# Patient Record
Sex: Female | Born: 1975 | Race: Black or African American | Hispanic: No | Marital: Single | Smoking: Never smoker
Health system: Southern US, Community
[De-identification: ages and names within clinical notes are randomized; demographics above are authoritative.]

## PROBLEM LIST (undated history)

## (undated) HISTORY — PX: TUBAL LIGATION: SHX77

---

## 1998-02-21 ENCOUNTER — Other Ambulatory Visit: Admission: RE | Admit: 1998-02-21 | Discharge: 1998-02-21 | Payer: Self-pay | Admitting: Obstetrics

## 2000-12-14 ENCOUNTER — Emergency Department (HOSPITAL_COMMUNITY): Admission: EM | Admit: 2000-12-14 | Discharge: 2000-12-14 | Payer: Self-pay | Admitting: *Deleted

## 2002-01-26 ENCOUNTER — Other Ambulatory Visit: Admission: RE | Admit: 2002-01-26 | Discharge: 2002-01-26 | Payer: Self-pay | Admitting: Obstetrics and Gynecology

## 2003-06-20 ENCOUNTER — Other Ambulatory Visit: Admission: RE | Admit: 2003-06-20 | Discharge: 2003-06-20 | Payer: Self-pay | Admitting: Obstetrics and Gynecology

## 2003-09-04 ENCOUNTER — Inpatient Hospital Stay (HOSPITAL_COMMUNITY): Admission: AD | Admit: 2003-09-04 | Discharge: 2003-09-04 | Payer: Self-pay | Admitting: Obstetrics and Gynecology

## 2003-09-30 ENCOUNTER — Inpatient Hospital Stay (HOSPITAL_COMMUNITY): Admission: AD | Admit: 2003-09-30 | Discharge: 2003-10-03 | Payer: Self-pay | Admitting: Obstetrics and Gynecology

## 2005-09-21 ENCOUNTER — Emergency Department (HOSPITAL_COMMUNITY): Admission: AD | Admit: 2005-09-21 | Discharge: 2005-09-21 | Payer: Self-pay | Admitting: Family Medicine

## 2011-02-06 ENCOUNTER — Other Ambulatory Visit: Payer: Self-pay | Admitting: Obstetrics and Gynecology

## 2011-02-06 DIAGNOSIS — N6322 Unspecified lump in the left breast, upper inner quadrant: Secondary | ICD-10-CM

## 2011-02-12 ENCOUNTER — Ambulatory Visit
Admission: RE | Admit: 2011-02-12 | Discharge: 2011-02-12 | Disposition: A | Discharge status: Home / Self Care | Payer: 59 | Source: Ambulatory Visit | Attending: Obstetrics and Gynecology | Admitting: Obstetrics and Gynecology

## 2011-02-12 DIAGNOSIS — N6322 Unspecified lump in the left breast, upper inner quadrant: Secondary | ICD-10-CM

## 2012-12-30 ENCOUNTER — Ambulatory Visit (INDEPENDENT_AMBULATORY_CARE_PROVIDER_SITE_OTHER): Payer: 59 | Admitting: Emergency Medicine

## 2012-12-30 VITALS — BP 121/81 | HR 85 | Temp 98.0°F | Resp 16 | Ht 65.0 in | Wt 123.0 lb

## 2012-12-30 DIAGNOSIS — J029 Acute pharyngitis, unspecified: Secondary | ICD-10-CM

## 2012-12-30 MED ORDER — AZITHROMYCIN 250 MG PO TABS: ORAL_TABLET | ORAL | Status: DC

## 2012-12-30 NOTE — Patient Instructions (Addendum)
Strep Throat  Strep throat is an infection of the throat caused by a bacteria named Streptococcus pyogenes. Your caregiver may call the infection streptococcal "tonsillitis" or "pharyngitis" depending on whether there are signs of inflammation in the tonsils or back of the throat. Strep throat is most common in children aged 35 15 years during the cold months of the year, but it can occur in people of any age during any season. This infection is spread from person to person (contagious) through coughing, sneezing, or other close contact.  SYMPTOMS   · Fever or chills.  · Painful, swollen, red tonsils or throat.  · Pain or difficulty when swallowing.  · White or yellow spots on the tonsils or throat.  · Swollen, tender lymph nodes or "glands" of the neck or under the jaw.  · Red rash all over the body (rare).  DIAGNOSIS   Many different infections can cause the same symptoms. A test must be done to confirm the diagnosis so the right treatment can be given. A "rapid strep test" can help your caregiver make the diagnosis in a few minutes. If this test is not available, a light swab of the infected area can be used for a throat culture test. If a throat culture test is done, results are usually available in a day or two.  TREATMENT   Strep throat is treated with antibiotic medicine.  HOME CARE INSTRUCTIONS   · Gargle with 1 tsp of salt in 1 cup of warm water, 3 4 times per day or as needed for comfort.  · Family members who also have a sore throat or fever should be tested for strep throat and treated with antibiotics if they have the strep infection.  · Make sure everyone in your household washes their hands well.  · Do not share food, drinking cups, or personal items that could cause the infection to spread to others.  · You may need to eat a soft food diet until your sore throat gets better.  · Drink enough water and fluids to keep your urine clear or pale yellow. This will help prevent dehydration.  · Get plenty of  rest.  · Stay home from school, daycare, or work until you have been on antibiotics for 24 hours.  · Only take over-the-counter or prescription medicines for pain, discomfort, or fever as directed by your caregiver.  · If antibiotics are prescribed, take them as directed. Finish them even if you start to feel better.  SEEK MEDICAL CARE IF:   · The glands in your neck continue to enlarge.  · You develop a rash, cough, or earache.  · You cough up green, yellow-brown, or bloody sputum.  · You have pain or discomfort not controlled by medicines.  · Your problems seem to be getting worse rather than better.  SEEK IMMEDIATE MEDICAL CARE IF:   · You develop any new symptoms such as vomiting, severe headache, stiff or painful neck, chest pain, shortness of breath, or trouble swallowing.  · You develop severe throat pain, drooling, or changes in your voice.  · You develop swelling of the neck, or the skin on the neck becomes red and tender.  · You have a fever.  · You develop signs of dehydration, such as fatigue, dry mouth, and decreased urination.  · You become increasingly sleepy, or you cannot wake up completely.  Document Released: 07/04/2000 Document Revised: 06/23/2012 Document Reviewed: 09/05/2010  [employer]® Patient Information ©2014 [employer].

## 2012-12-30 NOTE — Progress Notes (Signed)
[facility] [address] [place] [address] [phone]  Date:  12/30/2012   Name:  Alicia Burgess   DOB:  [birth year]   MRN:  [id number]  PCP:  No PCP Per Patient    Chief Complaint: Sore Throat   History of Present Illness:  Alicia Burgess is a 37 y.o. very pleasant female patient who presents with the following:  Sore throat and fever.  No nausea or vomiting. No wheezing, shortness of breath or cough.  No coryza.  No rash.  No ear pain.  Has a fever of 101+ today.  No improvement with over the counter medications or other home remedies. Denies other complaint or health concern today.   There are no active problems to display for this patient.   History reviewed. No pertinent past medical history.  Past Surgical History  Procedure Laterality Date  . Cesarean section    . Tubal ligation      History  Substance Use Topics  . Smoking status: Never Smoker   . Smokeless tobacco: Not on file  . Alcohol Use: Yes    Family History  Problem Relation Age of Onset  . Lupus Sister     Allergies  Allergen Reactions  . Penicillins Itching    Medication list has been reviewed and updated.  No current outpatient prescriptions on file prior to visit.   No current facility-administered medications on file prior to visit.    Review of Systems:  As per HPI, otherwise negative.    Physical Examination: Filed Vitals:   12/30/12 2010  BP: 121/81  Pulse: 85  Temp: 98 F (36.7 C)  Resp: 16   Filed Vitals:   12/30/12 2010  Height: 5\' 5"  (1.651 m)  Weight: 123 lb (55.792 kg)   Body mass index is 20.47 kg/(m^2). Ideal Body Weight: Weight in (lb) to have BMI = 25: 149.9  GEN: WDWN, NAD, Non-toxic, A & O x 3 HEENT: Atraumatic, Normocephalic. Neck supple. No masses, No LAD.  Erythematous throat Ears and Nose: No external deformity. CV: RRR, No M/G/R. No JVD. No thrill. No extra heart sounds. PULM: CTA B, no wheezes, crackles, rhonchi. No  retractions. No resp. distress. No accessory muscle use. ABD: S, NT, ND, +BS. No rebound. No HSM. EXTR: No c/c/e NEURO Normal gait.  PSYCH: Normally interactive. Conversant. Not depressed or anxious appearing.  Calm demeanor.    Assessment and Plan: Acute pharyngitis Pen v k Follow up as needed   Signed,  Phillips Odor, MD

## 2017-03-06 ENCOUNTER — Emergency Department (HOSPITAL_COMMUNITY): Payer: 59

## 2017-03-06 ENCOUNTER — Emergency Department (HOSPITAL_COMMUNITY)
Admission: EM | Admit: 2017-03-06 | Discharge: 2017-03-07 | Disposition: A | Discharge status: Home / Self Care | Payer: 59 | Attending: Emergency Medicine | Admitting: Emergency Medicine

## 2017-03-06 ENCOUNTER — Encounter (HOSPITAL_COMMUNITY): Payer: Self-pay

## 2017-03-06 DIAGNOSIS — R200 Anesthesia of skin: Secondary | ICD-10-CM | POA: Diagnosis not present

## 2017-03-06 DIAGNOSIS — Z79899 Other long term (current) drug therapy: Secondary | ICD-10-CM | POA: Insufficient documentation

## 2017-03-06 DIAGNOSIS — R42 Dizziness and giddiness: Secondary | ICD-10-CM | POA: Diagnosis not present

## 2017-03-06 DIAGNOSIS — R531 Weakness: Secondary | ICD-10-CM

## 2017-03-06 DIAGNOSIS — R202 Paresthesia of skin: Secondary | ICD-10-CM | POA: Insufficient documentation

## 2017-03-06 DIAGNOSIS — R40241 Glasgow coma scale score 13-15, unspecified time: Secondary | ICD-10-CM | POA: Insufficient documentation

## 2017-03-06 LAB — COMPREHENSIVE METABOLIC PANEL
ALK PHOS: 63 U/L (ref 38–126)
ALT: 18 U/L (ref 14–54)
AST: 23 U/L (ref 15–41)
Albumin: 3.8 g/dL (ref 3.5–5.0)
Anion gap: 8 (ref 5–15)
BUN: 9 mg/dL (ref 6–20)
CALCIUM: 9.2 mg/dL (ref 8.9–10.3)
CO2: 24 mmol/L (ref 22–32)
CREATININE: 0.73 mg/dL (ref 0.44–1.00)
Chloride: 107 mmol/L (ref 101–111)
GFR calc non Af Amer: >60 mL/min (ref >60)
Glucose, Bld: 92 mg/dL (ref 65–99)
Potassium: 3.8 mmol/L (ref 3.5–5.1)
SODIUM: 139 mmol/L (ref 135–145)
Total Bilirubin: 0.4 mg/dL (ref 0.3–1.2)
Total Protein: 7.4 g/dL (ref 6.5–8.1)

## 2017-03-06 LAB — I-STAT CHEM 8, ED
BUN: 12 mg/dL (ref 6–20)
CALCIUM ION: 1.17 mmol/L (ref 1.15–1.40)
CHLORIDE: 105 mmol/L (ref 101–111)
CREATININE: 0.6 mg/dL (ref 0.44–1.00)
GLUCOSE: 94 mg/dL (ref 65–99)
HCT: 31 % — ABNORMAL LOW (ref 36.0–46.0)
Hemoglobin: 10.5 g/dL — ABNORMAL LOW (ref 12.0–15.0)
Potassium: 3.6 mmol/L (ref 3.5–5.1)
Sodium: 141 mmol/L (ref 135–145)
TCO2: 25 mmol/L (ref 0–100)

## 2017-03-06 LAB — PROTIME-INR
INR: 0.99
Prothrombin Time: 13.1 seconds (ref 11.4–15.2)

## 2017-03-06 LAB — CBC
HEMATOCRIT: 31.5 % — AB (ref 36.0–46.0)
Hemoglobin: 10.1 g/dL — ABNORMAL LOW (ref 12.0–15.0)
MCH: 26 pg (ref 26.0–34.0)
MCHC: 32.1 g/dL (ref 30.0–36.0)
MCV: 81.2 fL (ref 78.0–100.0)
Platelets: 429 10*3/uL — ABNORMAL HIGH (ref 150–400)
RBC: 3.88 MIL/uL (ref 3.87–5.11)
RDW: 14.9 % (ref 11.5–15.5)
WBC: 8.5 10*3/uL (ref 4.0–10.5)

## 2017-03-06 LAB — I-STAT TROPONIN, ED: Troponin i, poc: 0 ng/mL (ref 0.00–0.08)

## 2017-03-06 LAB — DIFFERENTIAL
Basophils Absolute: 0 10*3/uL (ref 0.0–0.1)
Basophils Relative: 0 %
Eosinophils Absolute: 0.7 10*3/uL (ref 0.0–0.7)
Eosinophils Relative: 8 %
LYMPHS PCT: 45 %
Lymphs Abs: 3.8 10*3/uL (ref 0.7–4.0)
MONO ABS: 0.7 10*3/uL (ref 0.1–1.0)
MONOS PCT: 8 %
NEUTROS ABS: 3.3 10*3/uL (ref 1.7–7.7)
Neutrophils Relative %: 39 %

## 2017-03-06 LAB — APTT: aPTT: 28 seconds (ref 24–36)

## 2017-03-06 MED ORDER — IOPAMIDOL (ISOVUE-370) INJECTION 76%
INTRAVENOUS | Status: AC
  Filled 2017-03-06: qty 50

## 2017-03-06 MED ORDER — LORAZEPAM 2 MG/ML IJ SOLN
1.0000 mg | Freq: Once | INTRAMUSCULAR | Status: AC
  Administered 2017-03-06: 1 mg via INTRAVENOUS
  Filled 2017-03-06: qty 1

## 2017-03-06 MED ORDER — GADOBENATE DIMEGLUMINE 529 MG/ML IV SOLN
15.0000 mL | Freq: Once | INTRAVENOUS | Status: AC
  Administered 2017-03-06: 15 mL via INTRAVENOUS

## 2017-03-06 NOTE — Code Documentation (Signed)
Code stroke called. Patient admitted with left arm/leg/face weakness, numbness, and tingling.  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 Code stroke cancelled per MD at 2049

## 2017-03-06 NOTE — ED Notes (Signed)
Patient transported to MRI 

## 2017-03-06 NOTE — ED Triage Notes (Signed)
Pt states that she was leaving work and that around 5pm her L arm started having numbness and tingling, up into shoulder, neck area, some dizziness which has resolved. Pt has L arm weakness and L arm drift.

## 2017-03-06 NOTE — ED Notes (Signed)
[facility] contacted to activate Code Stroke 

## 2017-03-06 NOTE — ED Notes (Signed)
[facility] contacted to deactivate Code Stroke

## 2017-03-06 NOTE — ED Provider Notes (Signed)
[facility]-EMERGENCY DEPT Provider Note   CSN: [id number] Arrival date & time: 03/06/17  1812     History   Chief Complaint Chief Complaint  Patient presents with  . Code Stroke    HPI Alicia Burgess is a 41 y.o. female.  HPI Patient is a previously healthy female who presents with numbness, tingling and weakness of left upper extremity starting at 5PM this evening. Patient noted onset of symptoms when she was taking her shirt off after work, noticed her left arm going limp  with associated numbness and tingling. She also had some associated dizziness. Her ventilator to come to the ED. She denies any similar symptoms to this in the past. Since her arrival to the ED, she has noticed some radiation of the numbness and tingling up her left neck, left tongue numbness, and left lower facial numbness. She denies any chest pain or shortness of breath. Code stroke activated in triage due to left upper extremity weakness.   History reviewed. No pertinent past medical history.  There are no active problems to display for this patient.   Past Surgical History:  Procedure Laterality Date  . CESAREAN SECTION    . TUBAL LIGATION      OB History    No data available       Home Medications    Prior to Admission medications   Medication Sig Start Date End Date Taking? Authorizing Provider  acetaminophen (TYLENOL) 500 MG tablet Take 1,000 mg by mouth every 6 (six) hours as needed for headache (pain).   Yes [provider]  ibuprofen (ADVIL,MOTRIN) 200 MG tablet Take 600 mg by mouth every 6 (six) hours as needed for headache (pain).   Yes [provider]  Multiple Vitamin (MULTIVITAMIN WITH MINERALS) TABS tablet Take 1 tablet by mouth at bedtime.   Yes [provider]    Family History Family History  Problem Relation Age of Onset  . Lupus Sister     Social History Social History  Substance Use Topics  . Smoking status: Never Smoker  . Smokeless tobacco:  Never Used  . Alcohol use Yes     Allergies   Strawberry (diagnostic) and Penicillins   Review of Systems Review of Systems  Constitutional: Negative for chills and fever.  HENT: Negative for ear pain and sore throat.   Eyes: Negative for pain and visual disturbance.  Respiratory: Negative for cough and shortness of breath.   Cardiovascular: Negative for chest pain and palpitations.  Gastrointestinal: Negative for abdominal pain and vomiting.  Genitourinary: Negative for dysuria and hematuria.  Musculoskeletal: Negative for arthralgias and back pain.  Skin: Negative for color change and rash.  Neurological: Positive for dizziness, weakness and numbness. Negative for seizures and syncope.  All other systems reviewed and are negative.    Physical Exam Updated Vital Signs BP 113/69   Pulse 74   Temp 98.8 F (37.1 C) (Oral)   Resp 18   Ht 5\' 8"  (1.727 m)   Wt 66.7 kg (147 lb)   SpO2 98%   BMI 22.35 kg/m   Physical Exam  Constitutional: She is oriented to person, place, and time. She appears well-developed and well-nourished. No distress.  HENT:  Head: Normocephalic and atraumatic.  Eyes: Conjunctivae are normal.  Neck: Neck supple.  Cardiovascular: Normal rate and regular rhythm.   No murmur heard. Pulmonary/Chest: Effort normal and breath sounds normal. No respiratory distress.  Abdominal: Soft. There is no tenderness.  Musculoskeletal: She exhibits no edema.  Neurological: She is alert and oriented to person, place, and time. She has normal strength. A cranial nerve deficit (decreased sensation to light tough left lower face) and sensory deficit (decreased sensation to LT to LUE) is present. She displays a negative Romberg sign. Coordination and gait normal. GCS eye subscore is 4. GCS verbal subscore is 5. GCS motor subscore is 6.  Inconsistent motor exam. Able to demonstrate 5/5 strength LUE intermittently.   Skin: Skin is warm and dry.  Psychiatric: She has a  normal mood and affect.  Nursing note and vitals reviewed.    ED Treatments / Results  Labs (all labs ordered are listed, but only abnormal results are displayed) Labs Reviewed  CBC - Abnormal; Notable for the following:       Result Value   Hemoglobin 10.1 (*)    HCT 31.5 (*)    Platelets 429 (*)    All other components within normal limits  I-STAT CHEM 8, ED - Abnormal; Notable for the following:    Hemoglobin 10.5 (*)    HCT 31.0 (*)    All other components within normal limits  PROTIME-INR  APTT  DIFFERENTIAL  COMPREHENSIVE METABOLIC PANEL  I-STAT TROPONIN, ED  CBG MONITORING, ED    EKG  EKG Interpretation None       Radiology Ct Angio Head W Or Wo Contrast  Addendum Date: 03/06/2017   ADDENDUM REPORT: 03/06/2017 20:51 ADDENDUM: These results were called by telephone at the time of interpretation on 03/06/2017 at 8:45 pm to Dr. Caryl Pina , who verbally acknowledged these results. Correction to "IMPRESSION" : There are only 3 items in the impression. Impression #4 should have been numbered #3 with no 4th item. Electronically Signed   By: Mitzi Hansen M.D.   On: 03/06/2017 20:51   Result Date: 03/06/2017 CLINICAL DATA:  41 y/o  F; left-sided weakness. EXAM: CT ANGIOGRAPHY HEAD AND NECK TECHNIQUE: Multidetector CT imaging of the head and neck was performed using the standard protocol during bolus administration of intravenous contrast. Multiplanar CT image reconstructions and MIPs were obtained to evaluate the vascular anatomy. Carotid stenosis measurements (when applicable) are obtained utilizing NASCET criteria, using the distal internal carotid diameter as the denominator. CONTRAST:  50 cc Isovue 370 COMPARISON:  03/06/2017 CT of the head. FINDINGS: CTA NECK FINDINGS Aortic arch: Bovine arch. Imaged portion shows no evidence of aneurysm or dissection. No significant stenosis of the major arch vessel origins. Right carotid system: No evidence of dissection,  stenosis (50% or greater) or occlusion. Left carotid system: No evidence of dissection, stenosis (50% or greater) or occlusion. Vertebral arteries: Codominant. No evidence of dissection, stenosis (50% or greater) or occlusion. Skeleton: Left maxillary molar dental carie. Otherwise unremarkable. Other neck: Negative. Upper chest: Negative. Review of the MIP images confirms the above findings CTA HEAD FINDINGS Anterior circulation: No significant stenosis, proximal occlusion, or aneurysm. Small cluster of vessels in the right sylvian fissure near the frontoparietal junction measuring 7 x 12 x 8 mm (AP by ML series 7, image 82 and series 9, image 54) with both arterial and venous structures. No aneurysm. Posterior circulation: No significant stenosis, proximal occlusion, aneurysm, or vascular malformation. Venous sinuses: As permitted by contrast timing, patent. Anatomic variants: Anterior communicating artery and bilateral posterior communicating arteries are present. Review of the MIP images confirms the above findings IMPRESSION: 1. Patent carotid and vertebral arteries. No dissection, aneurysm, or hemodynamically significant stenosis utilizing NASCET criteria. 2. Patent circle of Willis. No large vessel  occlusion, aneurysm, or significant stenosis. 3. Small cluster of vessels in the right sylvian fissure near frontoparietal junction with both arterial and prominent venous components measuring up to 12 mm may represent a small arteriovenous malformation. Electronically Signed: By: Mitzi Hansen M.D. On: 03/06/2017 20:35   Ct Angio Neck W Or Wo Contrast  Addendum Date: 03/06/2017   ADDENDUM REPORT: 03/06/2017 20:51 ADDENDUM: These results were called by telephone at the time of interpretation on 03/06/2017 at 8:45 pm to Dr. Caryl Pina , who verbally acknowledged these results. Correction to "IMPRESSION" : There are only 3 items in the impression. Impression #4 should have been numbered #3 with no 4th  item. Electronically Signed   By: Mitzi Hansen M.D.   On: 03/06/2017 20:51   Result Date: 03/06/2017 CLINICAL DATA:  41 y/o  F; left-sided weakness. EXAM: CT ANGIOGRAPHY HEAD AND NECK TECHNIQUE: Multidetector CT imaging of the head and neck was performed using the standard protocol during bolus administration of intravenous contrast. Multiplanar CT image reconstructions and MIPs were obtained to evaluate the vascular anatomy. Carotid stenosis measurements (when applicable) are obtained utilizing NASCET criteria, using the distal internal carotid diameter as the denominator. CONTRAST:  50 cc Isovue 370 COMPARISON:  03/06/2017 CT of the head. FINDINGS: CTA NECK FINDINGS Aortic arch: Bovine arch. Imaged portion shows no evidence of aneurysm or dissection. No significant stenosis of the major arch vessel origins. Right carotid system: No evidence of dissection, stenosis (50% or greater) or occlusion. Left carotid system: No evidence of dissection, stenosis (50% or greater) or occlusion. Vertebral arteries: Codominant. No evidence of dissection, stenosis (50% or greater) or occlusion. Skeleton: Left maxillary molar dental carie. Otherwise unremarkable. Other neck: Negative. Upper chest: Negative. Review of the MIP images confirms the above findings CTA HEAD FINDINGS Anterior circulation: No significant stenosis, proximal occlusion, or aneurysm. Small cluster of vessels in the right sylvian fissure near the frontoparietal junction measuring 7 x 12 x 8 mm (AP by ML series 7, image 82 and series 9, image 54) with both arterial and venous structures. No aneurysm. Posterior circulation: No significant stenosis, proximal occlusion, aneurysm, or vascular malformation. Venous sinuses: As permitted by contrast timing, patent. Anatomic variants: Anterior communicating artery and bilateral posterior communicating arteries are present. Review of the MIP images confirms the above findings IMPRESSION: 1. Patent carotid  and vertebral arteries. No dissection, aneurysm, or hemodynamically significant stenosis utilizing NASCET criteria. 2. Patent circle of Willis. No large vessel occlusion, aneurysm, or significant stenosis. 3. Small cluster of vessels in the right sylvian fissure near frontoparietal junction with both arterial and prominent venous components measuring up to 12 mm may represent a small arteriovenous malformation. Electronically Signed: By: Mitzi Hansen M.D. On: 03/06/2017 20:35   Mr Maxine Glenn Head Wo Contrast  Result Date: 03/06/2017 CLINICAL DATA:  Followup AVM. EXAM: MRI HEAD WITHOUT AND WITH CONTRAST MRA HEAD WITHOUT CONTRAST TECHNIQUE: Multiplanar, multiecho pulse sequences of the brain and surrounding structures were obtained without and with intravenous contrast. Angiographic images of the head were obtained using MRA technique without contrast. CONTRAST:  15 cc MULTIHANCE GADOBENATE DIMEGLUMINE 529 MG/ML IV SOLN COMPARISON:  CTA HEAD March 06, 2017 at 2005 hours and CT HEAD March 06, 2017 at 1932 hours FINDINGS: MRI HEAD FINDINGS INTRACRANIAL CONTENTS: No reduced diffusion to suggest acute ischemia, hyperacute demyelination. No susceptibility artifact to suggest hemorrhage. The ventricles and sulci are normal for patient's age. Scattered subcentimeter supratentorial white matter FLAIR T2 predominately in deep and juxta cortical distribution hyperintensities.  No abnormal signal about the RIGHT frontoparietal vascular malformation. No suspicious parenchymal signal, masses, mass effect. No abnormal intraparenchymal or extra-axial enhancement. No abnormal extra-axial fluid collections. No extra-axial masses. Prominent vessels RIGHT sylvian fissure without intraparenchymal extension. VASCULAR: Normal major intracranial vascular flow voids present at skull base. SKULL AND UPPER CERVICAL SPINE: No abnormal sellar expansion. No suspicious calvarial bone marrow signal. Craniocervical junction maintained.  SINUSES/ORBITS: Paranasal sinus mucosal thickening with maxillary sinus air-fluid levels. Mastoid air cells are well aerated.The included ocular globes and orbital contents are non-suspicious. OTHER: None. MRA HEAD FINDINGS ANTERIOR CIRCULATION: Normal flow related enhancement of the included cervical, petrous, cavernous and supraclinoid internal carotid arteries. Patent anterior communicating artery. Patent anterior and middle cerebral arteries, including distal segments. Prominent loop of vessels within RIGHT sylvian fissure, no discrete aneurysm. No large vessel occlusion, high-grade stenosis, aneurysm. POSTERIOR CIRCULATION: Codominant vertebral artery's. Basilar artery is patent, with normal flow related enhancement of the main branch vessels. Patent posterior cerebral arteries. Bilateral posterior communicating artery is present. No large vessel occlusion, high-grade stenosis,  aneurysm. ANATOMIC VARIANTS: None. Source images and MIP images were reviewed. IMPRESSION: MRI HEAD: 1. No convincing evidence of arteriovenous malformation. Reference standard for vascular malformations is digital subtraction angiography. 2. Mild white matter changes seen with chronic small vessel ischemic disease. MRA HEAD: 1. No emergent large vessel occlusion or stenosis; negative MRA head. Electronically Signed   By: Awilda Metro M.D.   On: 03/06/2017 23:35   Mr Laqueta Jean WU Contrast  Result Date: 03/06/2017 CLINICAL DATA:  Followup AVM. EXAM: MRI HEAD WITHOUT AND WITH CONTRAST MRA HEAD WITHOUT CONTRAST TECHNIQUE: Multiplanar, multiecho pulse sequences of the brain and surrounding structures were obtained without and with intravenous contrast. Angiographic images of the head were obtained using MRA technique without contrast. CONTRAST:  15 cc MULTIHANCE GADOBENATE DIMEGLUMINE 529 MG/ML IV SOLN COMPARISON:  CTA HEAD March 06, 2017 at 2005 hours and CT HEAD March 06, 2017 at 1932 hours FINDINGS: MRI HEAD FINDINGS  INTRACRANIAL CONTENTS: No reduced diffusion to suggest acute ischemia, hyperacute demyelination. No susceptibility artifact to suggest hemorrhage. The ventricles and sulci are normal for patient's age. Scattered subcentimeter supratentorial white matter FLAIR T2 predominately in deep and juxta cortical distribution hyperintensities. No abnormal signal about the RIGHT frontoparietal vascular malformation. No suspicious parenchymal signal, masses, mass effect. No abnormal intraparenchymal or extra-axial enhancement. No abnormal extra-axial fluid collections. No extra-axial masses. Prominent vessels RIGHT sylvian fissure without intraparenchymal extension. VASCULAR: Normal major intracranial vascular flow voids present at skull base. SKULL AND UPPER CERVICAL SPINE: No abnormal sellar expansion. No suspicious calvarial bone marrow signal. Craniocervical junction maintained. SINUSES/ORBITS: Paranasal sinus mucosal thickening with maxillary sinus air-fluid levels. Mastoid air cells are well aerated.The included ocular globes and orbital contents are non-suspicious. OTHER: None. MRA HEAD FINDINGS ANTERIOR CIRCULATION: Normal flow related enhancement of the included cervical, petrous, cavernous and supraclinoid internal carotid arteries. Patent anterior communicating artery. Patent anterior and middle cerebral arteries, including distal segments. Prominent loop of vessels within RIGHT sylvian fissure, no discrete aneurysm. No large vessel occlusion, high-grade stenosis, aneurysm. POSTERIOR CIRCULATION: Codominant vertebral artery's. Basilar artery is patent, with normal flow related enhancement of the main branch vessels. Patent posterior cerebral arteries. Bilateral posterior communicating artery is present. No large vessel occlusion, high-grade stenosis,  aneurysm. ANATOMIC VARIANTS: None. Source images and MIP images were reviewed. IMPRESSION: MRI HEAD: 1. No convincing evidence of arteriovenous malformation. Reference  standard for vascular malformations is digital subtraction angiography. 2. Mild white matter changes seen  with chronic small vessel ischemic disease. MRA HEAD: 1. No emergent large vessel occlusion or stenosis; negative MRA head. Electronically Signed   By: Awilda Metro M.D.   On: 03/06/2017 23:35   Ct Head Code Stroke Wo Contrast  Result Date: 03/06/2017 CLINICAL DATA:  Code stroke. Left arm numbness and drift. Last seen normal 2.5 hours ago. EXAM: CT HEAD WITHOUT CONTRAST TECHNIQUE: Contiguous axial images were obtained from the base of the skull through the vertex without intravenous contrast. COMPARISON:  None. FINDINGS: Brain: Normal appearance without evidence of atrophy, old or acute infarction, mass lesion, hemorrhage, hydrocephalus or extra-axial collection. Vascular: No abnormal vascular findings. Skull: Normal Sinuses/Orbits: Normal Other: None ASPECTS ([place] Stroke Program Early CT Score) - Ganglionic level infarction (caudate, lentiform nuclei, internal capsule, insula, M1-M3 cortex): 7 - Supraganglionic infarction (M4-M6 cortex): 3 Total score (0-10 with 10 being normal): 10 IMPRESSION: 1. Normal head CT 2. ASPECTS is 10. Page placed at the time of interpretation on 03/06/2017 at 7:35 pm to Dr. Otelia Limes. Electronically Signed   By: Paulina Fusi M.D.   On: 03/06/2017 19:37    Procedures Procedures (including critical care time)  Medications Ordered in ED Medications  iopamidol (ISOVUE-370) 76 % injection (not administered)  LORazepam (ATIVAN) injection 1 mg (1 mg Intravenous Given 03/06/17 2143)  gadobenate dimeglumine (MULTIHANCE) injection 15 mL (15 mLs Intravenous Contrast Given 03/06/17 2301)     Initial Impression / Assessment and Plan / ED Course  I have reviewed the triage vital signs and the nursing notes.  Pertinent labs & imaging results that were available during my care of the patient were reviewed by me and considered in my medical decision making (see chart for  details).     Patient is a previously healthy 41 y/o female who presents with acute onset left upper extremity numbness and tingling. Code stroke initiated on arrival due to paresthesias and left pronator drift in triage.  Patient arrived [facility] in no acute distress. Inconsistent effort on neuro exam. Numbness and tingling left upper extremity. No neck tenderness to palpation.   CT head negative, CTA head and neck showing possible AV malformation. Code stroke canceled per Neurology. MRI negative for acute findings. Labs stable.   Patient with subjective improvement of symptoms on repeat assessment. Reassured results. Discussed possibility of cervical radiculopathy, although given acute onset of symptoms without neck pain less likely. Discussed conversion disorder. Patient admitting to several stressors in her life right now. I advised close follow-up with her primary care physician. Return precautions discussed. Patient agreement with plan at time of discharge.  Patient and plan of care discussed with Attending physician, Dr. Lynelle Doctor.    Final Clinical Impressions(s) / ED Diagnoses   Final diagnoses:  Numbness  Weakness    New Prescriptions New Prescriptions   No medications on file     Wynelle Cleveland, MD 03/07/17 Jetta Lout, MD 03/07/17 1350

## 2017-03-07 DIAGNOSIS — R531 Weakness: Secondary | ICD-10-CM

## 2017-03-07 DIAGNOSIS — R200 Anesthesia of skin: Secondary | ICD-10-CM

## 2017-03-07 NOTE — Discharge Instructions (Signed)
Your work up was reassuring today. Please return to ED with any new or concerning symptoms. Please follow up with PCM as instructed.

## 2017-03-07 NOTE — Consult Note (Signed)
NEURO HOSPITALIST CONSULT NOTE   Requestig physician: Dr. Marcina Millard  Reason for Consult: Acute onset of left arm numbness and weakness  History obtained from:  Patient   HPI:                                                                                                                                          Alicia Burgess is an 41 y.o. female who presents to the ED with acute onset of LUE weakness, paresthesia and sensory numbness extending from her shoulder to her fingertips circumferentially. Sensory symptoms also radiated to the left side of her neck. Symptoms began at 5 PM. She first noticed the symptoms while taking her shirt off after work, with her left arm going limp in conjunction with the above sensory symptoms. She had some dizziness described as presyncopal at that time. Her daughter told her to come to the ED. After ED arrival she also noted some radiation of numbness to her left tongue and left lower face.  Denies headache, chest pain, vision changes, confusion or difficulty understanding speech. Also no chills, fever, cough, SOB or dysuria.    History reviewed. No pertinent past medical history.  Past Surgical History:  Procedure Laterality Date  . CESAREAN SECTION    . TUBAL LIGATION      Family History  Problem Relation Age of Onset  . Lupus Sister     Social History:  reports that she has never smoked. She has never used smokeless tobacco. She reports that she drinks alcohol. She reports that she does not use drugs.  Allergies  Allergen Reactions  . Strawberry (Diagnostic) Shortness Of Breath and Rash  . Penicillins Itching    Felt like skin was crawling Has patient had a PCN reaction causing immediate rash, facial/tongue/throat swelling, SOB or lightheadedness with hypotension: No Has patient had a PCN reaction causing severe rash involving mucus membranes or skin necrosis: No Has patient had a PCN reaction that required hospitalization:  No Has patient had a PCN reaction occurring within the last 10 years: Yes If all of the above answers are "NO", then may proceed with Cephalosporin use.     HOME MEDICATIONS:  Tylenol Ibuprofen MVI   ROS:                                                                                                                                       As per HPI.   Blood pressure 113/69, pulse 74, temperature 98.8 F (37.1 C), temperature source Oral, resp. rate 18, height 5\' 8"  (1.727 m), weight 66.7 kg (147 lb), SpO2 98 %.   General Examination:                                                                                                      HEENT-  [place]/AT    Lungs- Respirations unlabored Extremities- No edema  Neurological Examination Mental Status: Awake and alert, oriented, thought content appropriate. In the context of varying degrees of hypophonia, speech fluent with intact comprehension, naming and repetition. Able to follow all commands without difficulty. Per nursing, the patient's hypophonia resolves intermittently when speaking in private with nursing staff.  Cranial Nerves: II: Visual fields intact. PERRL  III,IV, VI: ptosis not present, EOMI without nystagmus V,VII: smile symmetric, facial temp sensation decreased on the left VIII: hearing intact to voice IX,X: intermittent hypophonia XI: Decreased effort with left shoulder shrug XII: midline tongue extension Motor: LUE: Slow coordinated movement with 4/5 maximum elicitable strength with some giveway/variability and responsiveness to coaching LLE: 4/5 maximum elicitable HF, KE, ADF and APF with some variability and responsiveness to coaching RUE and RLE: 5/5. Exhibited muscle contraction of knee extensors when asked to relax leg.  Normal tone throughout; no atrophy noted Sensory: Temp decreased to LUE; normal to  LLE, RLE and RUE. Intermittent subjective loss of sensation to FT LUE and LLE. Normal FT RUE and RLE Deep Tendon Reflexes: 2+ brachioradialis, biceps, patella and achilles bilaterally. Toes downgoing.  Cerebellar: No ataxia with FNF on right. Slow, deliberate, coordinated movements with FNF on left, exhibiting no ataxia.  Gait: Deferred  Lab Results: Basic Metabolic Panel:  Recent Labs Lab 03/06/17 1920 03/06/17 1931  NA 139 141  K 3.8 3.6  CL 107 105  CO2 24  --   GLUCOSE 92 94  BUN 9 12  CREATININE 0.73 0.60  CALCIUM 9.2  --     Liver Function Tests:  Recent Labs Lab 03/06/17 1920  AST 23  ALT 18  ALKPHOS 63  BILITOT 0.4  PROT 7.4  ALBUMIN 3.8   No results for input(s): LIPASE, AMYLASE in the last 168 hours. No results for input(s): AMMONIA  in the last 168 hours.  CBC:  Recent Labs Lab 03/06/17 1920 03/06/17 1931  WBC 8.5  --   NEUTROABS 3.3  --   HGB 10.1* 10.5*  HCT 31.5* 31.0*  MCV 81.2  --   PLT 429*  --     Cardiac Enzymes: No results for input(s): CKTOTAL, CKMB, CKMBINDEX, TROPONINI in the last 168 hours.  Lipid Panel: No results for input(s): CHOL, TRIG, HDL, CHOLHDL, VLDL, LDLCALC in the last 168 hours.  CBG: No results for input(s): GLUCAP in the last 168 hours.  Microbiology: No results found for this or any previous visit.  Coagulation Studies:  Recent Labs  03/06/17 1920  LABPROT 13.1  INR 0.99    Imaging: Ct Angio Head W Or Wo Contrast  Addendum Date: 03/06/2017   ADDENDUM REPORT: 03/06/2017 20:51 ADDENDUM: These results were called by telephone at the time of interpretation on 03/06/2017 at 8:45 pm to Dr. Caryl Pina , who verbally acknowledged these results. Correction to "IMPRESSION" : There are only 3 items in the impression. Impression #4 should have been numbered #3 with no 4th item. Electronically Signed   By: Mitzi Hansen M.D.   On: 03/06/2017 20:51   Result Date: 03/06/2017 CLINICAL DATA:  41 y/o  F;  left-sided weakness. EXAM: CT ANGIOGRAPHY HEAD AND NECK TECHNIQUE: Multidetector CT imaging of the head and neck was performed using the standard protocol during bolus administration of intravenous contrast. Multiplanar CT image reconstructions and MIPs were obtained to evaluate the vascular anatomy. Carotid stenosis measurements (when applicable) are obtained utilizing NASCET criteria, using the distal internal carotid diameter as the denominator. CONTRAST:  50 cc Isovue 370 COMPARISON:  03/06/2017 CT of the head. FINDINGS: CTA NECK FINDINGS Aortic arch: Bovine arch. Imaged portion shows no evidence of aneurysm or dissection. No significant stenosis of the major arch vessel origins. Right carotid system: No evidence of dissection, stenosis (50% or greater) or occlusion. Left carotid system: No evidence of dissection, stenosis (50% or greater) or occlusion. Vertebral arteries: Codominant. No evidence of dissection, stenosis (50% or greater) or occlusion. Skeleton: Left maxillary molar dental carie. Otherwise unremarkable. Other neck: Negative. Upper chest: Negative. Review of the MIP images confirms the above findings CTA HEAD FINDINGS Anterior circulation: No significant stenosis, proximal occlusion, or aneurysm. Small cluster of vessels in the right sylvian fissure near the frontoparietal junction measuring 7 x 12 x 8 mm (AP by ML series 7, image 82 and series 9, image 54) with both arterial and venous structures. No aneurysm. Posterior circulation: No significant stenosis, proximal occlusion, aneurysm, or vascular malformation. Venous sinuses: As permitted by contrast timing, patent. Anatomic variants: Anterior communicating artery and bilateral posterior communicating arteries are present. Review of the MIP images confirms the above findings IMPRESSION: 1. Patent carotid and vertebral arteries. No dissection, aneurysm, or hemodynamically significant stenosis utilizing NASCET criteria. 2. Patent circle of Willis.  No large vessel occlusion, aneurysm, or significant stenosis. 3. Small cluster of vessels in the right sylvian fissure near frontoparietal junction with both arterial and prominent venous components measuring up to 12 mm may represent a small arteriovenous malformation. Electronically Signed: By: Mitzi Hansen M.D. On: 03/06/2017 20:35   Ct Angio Neck W Or Wo Contrast  Addendum Date: 03/06/2017   ADDENDUM REPORT: 03/06/2017 20:51 ADDENDUM: These results were called by telephone at the time of interpretation on 03/06/2017 at 8:45 pm to Dr. Caryl Pina , who verbally acknowledged these results. Correction to "IMPRESSION" : There are only 3 items  in the impression. Impression #4 should have been numbered #3 with no 4th item. Electronically Signed   By: Mitzi Hansen M.D.   On: 03/06/2017 20:51   Result Date: 03/06/2017 CLINICAL DATA:  41 y/o  F; left-sided weakness. EXAM: CT ANGIOGRAPHY HEAD AND NECK TECHNIQUE: Multidetector CT imaging of the head and neck was performed using the standard protocol during bolus administration of intravenous contrast. Multiplanar CT image reconstructions and MIPs were obtained to evaluate the vascular anatomy. Carotid stenosis measurements (when applicable) are obtained utilizing NASCET criteria, using the distal internal carotid diameter as the denominator. CONTRAST:  50 cc Isovue 370 COMPARISON:  03/06/2017 CT of the head. FINDINGS: CTA NECK FINDINGS Aortic arch: Bovine arch. Imaged portion shows no evidence of aneurysm or dissection. No significant stenosis of the major arch vessel origins. Right carotid system: No evidence of dissection, stenosis (50% or greater) or occlusion. Left carotid system: No evidence of dissection, stenosis (50% or greater) or occlusion. Vertebral arteries: Codominant. No evidence of dissection, stenosis (50% or greater) or occlusion. Skeleton: Left maxillary molar dental carie. Otherwise unremarkable. Other neck: Negative. Upper  chest: Negative. Review of the MIP images confirms the above findings CTA HEAD FINDINGS Anterior circulation: No significant stenosis, proximal occlusion, or aneurysm. Small cluster of vessels in the right sylvian fissure near the frontoparietal junction measuring 7 x 12 x 8 mm (AP by ML series 7, image 82 and series 9, image 54) with both arterial and venous structures. No aneurysm. Posterior circulation: No significant stenosis, proximal occlusion, aneurysm, or vascular malformation. Venous sinuses: As permitted by contrast timing, patent. Anatomic variants: Anterior communicating artery and bilateral posterior communicating arteries are present. Review of the MIP images confirms the above findings IMPRESSION: 1. Patent carotid and vertebral arteries. No dissection, aneurysm, or hemodynamically significant stenosis utilizing NASCET criteria. 2. Patent circle of Willis. No large vessel occlusion, aneurysm, or significant stenosis. 3. Small cluster of vessels in the right sylvian fissure near frontoparietal junction with both arterial and prominent venous components measuring up to 12 mm may represent a small arteriovenous malformation. Electronically Signed: By: Mitzi Hansen M.D. On: 03/06/2017 20:35   Mr Maxine Glenn Head Wo Contrast  Result Date: 03/06/2017 CLINICAL DATA:  Followup AVM. EXAM: MRI HEAD WITHOUT AND WITH CONTRAST MRA HEAD WITHOUT CONTRAST TECHNIQUE: Multiplanar, multiecho pulse sequences of the brain and surrounding structures were obtained without and with intravenous contrast. Angiographic images of the head were obtained using MRA technique without contrast. CONTRAST:  15 cc MULTIHANCE GADOBENATE DIMEGLUMINE 529 MG/ML IV SOLN COMPARISON:  CTA HEAD March 06, 2017 at 2005 hours and CT HEAD March 06, 2017 at 1932 hours FINDINGS: MRI HEAD FINDINGS INTRACRANIAL CONTENTS: No reduced diffusion to suggest acute ischemia, hyperacute demyelination. No susceptibility artifact to suggest hemorrhage.  The ventricles and sulci are normal for patient's age. Scattered subcentimeter supratentorial white matter FLAIR T2 predominately in deep and juxta cortical distribution hyperintensities. No abnormal signal about the RIGHT frontoparietal vascular malformation. No suspicious parenchymal signal, masses, mass effect. No abnormal intraparenchymal or extra-axial enhancement. No abnormal extra-axial fluid collections. No extra-axial masses. Prominent vessels RIGHT sylvian fissure without intraparenchymal extension. VASCULAR: Normal major intracranial vascular flow voids present at skull base. SKULL AND UPPER CERVICAL SPINE: No abnormal sellar expansion. No suspicious calvarial bone marrow signal. Craniocervical junction maintained. SINUSES/ORBITS: Paranasal sinus mucosal thickening with maxillary sinus air-fluid levels. Mastoid air cells are well aerated.The included ocular globes and orbital contents are non-suspicious. OTHER: None. MRA HEAD FINDINGS ANTERIOR CIRCULATION: Normal flow related  enhancement of the included cervical, petrous, cavernous and supraclinoid internal carotid arteries. Patent anterior communicating artery. Patent anterior and middle cerebral arteries, including distal segments. Prominent loop of vessels within RIGHT sylvian fissure, no discrete aneurysm. No large vessel occlusion, high-grade stenosis, aneurysm. POSTERIOR CIRCULATION: Codominant vertebral artery's. Basilar artery is patent, with normal flow related enhancement of the main branch vessels. Patent posterior cerebral arteries. Bilateral posterior communicating artery is present. No large vessel occlusion, high-grade stenosis,  aneurysm. ANATOMIC VARIANTS: None. Source images and MIP images were reviewed. IMPRESSION: MRI HEAD: 1. No convincing evidence of arteriovenous malformation. Reference standard for vascular malformations is digital subtraction angiography. 2. Mild white matter changes seen with chronic small vessel ischemic  disease. MRA HEAD: 1. No emergent large vessel occlusion or stenosis; negative MRA head. Electronically Signed   By: Awilda Metro M.D.   On: 03/06/2017 23:35   Mr Laqueta Jean GN Contrast  Result Date: 03/06/2017 CLINICAL DATA:  Followup AVM. EXAM: MRI HEAD WITHOUT AND WITH CONTRAST MRA HEAD WITHOUT CONTRAST TECHNIQUE: Multiplanar, multiecho pulse sequences of the brain and surrounding structures were obtained without and with intravenous contrast. Angiographic images of the head were obtained using MRA technique without contrast. CONTRAST:  15 cc MULTIHANCE GADOBENATE DIMEGLUMINE 529 MG/ML IV SOLN COMPARISON:  CTA HEAD March 06, 2017 at 2005 hours and CT HEAD March 06, 2017 at 1932 hours FINDINGS: MRI HEAD FINDINGS INTRACRANIAL CONTENTS: No reduced diffusion to suggest acute ischemia, hyperacute demyelination. No susceptibility artifact to suggest hemorrhage. The ventricles and sulci are normal for patient's age. Scattered subcentimeter supratentorial white matter FLAIR T2 predominately in deep and juxta cortical distribution hyperintensities. No abnormal signal about the RIGHT frontoparietal vascular malformation. No suspicious parenchymal signal, masses, mass effect. No abnormal intraparenchymal or extra-axial enhancement. No abnormal extra-axial fluid collections. No extra-axial masses. Prominent vessels RIGHT sylvian fissure without intraparenchymal extension. VASCULAR: Normal major intracranial vascular flow voids present at skull base. SKULL AND UPPER CERVICAL SPINE: No abnormal sellar expansion. No suspicious calvarial bone marrow signal. Craniocervical junction maintained. SINUSES/ORBITS: Paranasal sinus mucosal thickening with maxillary sinus air-fluid levels. Mastoid air cells are well aerated.The included ocular globes and orbital contents are non-suspicious. OTHER: None. MRA HEAD FINDINGS ANTERIOR CIRCULATION: Normal flow related enhancement of the included cervical, petrous, cavernous and  supraclinoid internal carotid arteries. Patent anterior communicating artery. Patent anterior and middle cerebral arteries, including distal segments. Prominent loop of vessels within RIGHT sylvian fissure, no discrete aneurysm. No large vessel occlusion, high-grade stenosis, aneurysm. POSTERIOR CIRCULATION: Codominant vertebral artery's. Basilar artery is patent, with normal flow related enhancement of the main branch vessels. Patent posterior cerebral arteries. Bilateral posterior communicating artery is present. No large vessel occlusion, high-grade stenosis,  aneurysm. ANATOMIC VARIANTS: None. Source images and MIP images were reviewed. IMPRESSION: MRI HEAD: 1. No convincing evidence of arteriovenous malformation. Reference standard for vascular malformations is digital subtraction angiography. 2. Mild white matter changes seen with chronic small vessel ischemic disease. MRA HEAD: 1. No emergent large vessel occlusion or stenosis; negative MRA head. Electronically Signed   By: Awilda Metro M.D.   On: 03/06/2017 23:35   Ct Head Code Stroke Wo Contrast  Result Date: 03/06/2017 CLINICAL DATA:  Code stroke. Left arm numbness and drift. Last seen normal 2.5 hours ago. EXAM: CT HEAD WITHOUT CONTRAST TECHNIQUE: Contiguous axial images were obtained from the base of the skull through the vertex without intravenous contrast. COMPARISON:  None. FINDINGS: Brain: Normal appearance without evidence of atrophy, old or acute infarction, mass lesion, hemorrhage,  hydrocephalus or extra-axial collection. Vascular: No abnormal vascular findings. Skull: Normal Sinuses/Orbits: Normal Other: None ASPECTS (Alberta Stroke Program Early CT Score) - Ganglionic level infarction (caudate, lentiform nuclei, internal capsule, insula, M1-M3 cortex): 7 - Supraganglionic infarction (M4-M6 cortex): 3 Total score (0-10 with 10 being normal): 10 IMPRESSION: 1. Normal head CT 2. ASPECTS is 10. Page placed at the time of interpretation on  03/06/2017 at 7:35 pm to Dr. Otelia Limes. Electronically Signed   By: Paulina Fusi M.D.   On: 03/06/2017 19:37    Assessment: 41 year old female with acute onset of LUE weakness, sensory numbness and paresthesia 1. CT head negative. 2. CTA head and neck shows no LVO.  3. CTA also shows a small cluster of vessels in the right sylvian fissure near frontoparietal junction with both arterial and prominent venous components measuring up to 12 mm may represent a small arteriovenous malformation. Discussed with Radiology. This finding was equivocal. MRI was recommended to further evaluate.  4. MRI head shows no convincing evidence of arteriovenous malformation. There are mild white matter changes seen with chronic small vessel ischemic disease.  5. MRA head is negative, with no emergent large vessel occlusion or stenosis; negative MRA head. 6. Overall clinical impression is that her presentation is most likely secondary to stress-related symptoms or conversion disorder than stroke, given giveway weakness and atypical symptoms involving only the LUE while sparing the face and leg. MRI brain was negative for acute infarction and MRA/CTA showed no LVO.   Recommendations: 1. Discussed risks/benefits of IV tPA with the patient. Given the potential risks, including permanent disability due to ICH and possible death, in comparison with the potential benefits in the setting of relatively minor left arm deficit, the patient declined tPA. 2. Discussed with ED physician. 3. Will need outpatient Neurology follow up.   Electronically signed: Dr. Caryl Pina 03/07/2017, 12:47 AM

## 2018-09-18 IMAGING — CT CT ANGIO NECK
2 of 7 series · 8 of 33 positions shown · IV contrast (OMNI 350)
Comparison: 03/06/2017 CT of the head.

ADDENDUM:
These results were called by telephone at the time of interpretation
on 03/06/2017 at [DATE] to Dr. ENI RETA , who verbally
acknowledged these results.

Correction to "IMPRESSION" :
There are only 3 items in the impression. Impression #4 should have
been numbered #3 with no 4th item.
By: Cosme Jeffries M.D.
CLINICAL DATA: 41 y/o  F; left-sided weakness.
EXAM:
CT ANGIOGRAPHY HEAD AND NECK
TECHNIQUE: Multidetector CT imaging of the head and neck was performed using
the standard protocol during bolus administration of intravenous
contrast. Multiplanar CT image reconstructions and MIPs were
obtained to evaluate the vascular anatomy. Carotid stenosis
measurements (when applicable) are obtained utilizing NASCET
criteria, using the distal internal carotid diameter as the
denominator.
CONTRAST:  50 cc Isovue 370

[Series 5: cta neck · axial · 0.50mm/px · z∈[-155,-35]mm · 2 of 181 slices shown]
[im 61/181  soft-tissue]
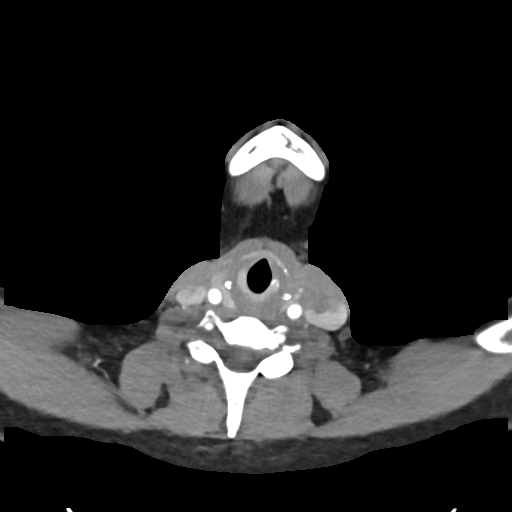
[im 121/181  soft-tissue]
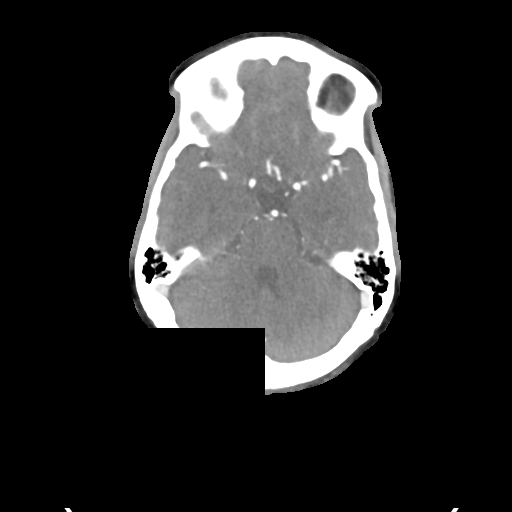

[Series 7: cta neck axial · axial · 0.39mm/px · z∈[-225,+30]mm · 6 of 357 slices shown]
[im 51/357  soft-tissue]
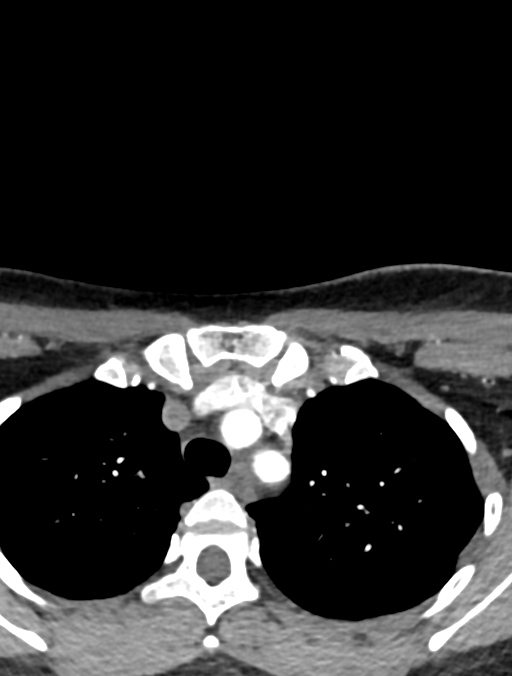
[im 102/357  bone]
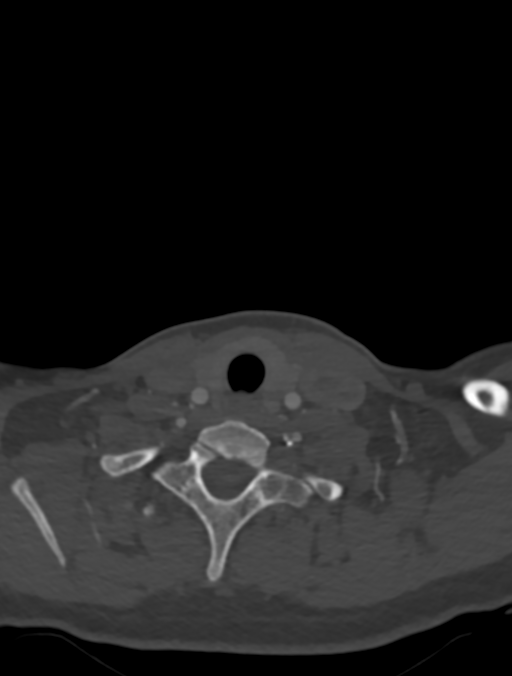
[im 153/357  soft-tissue]
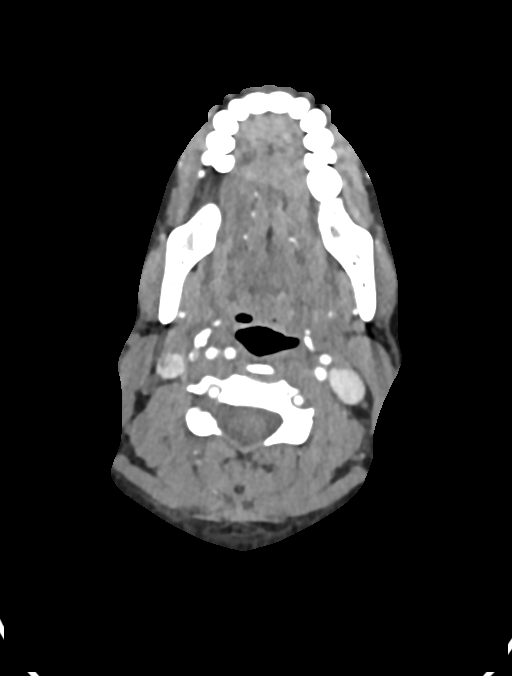
[im 204/357  bone]
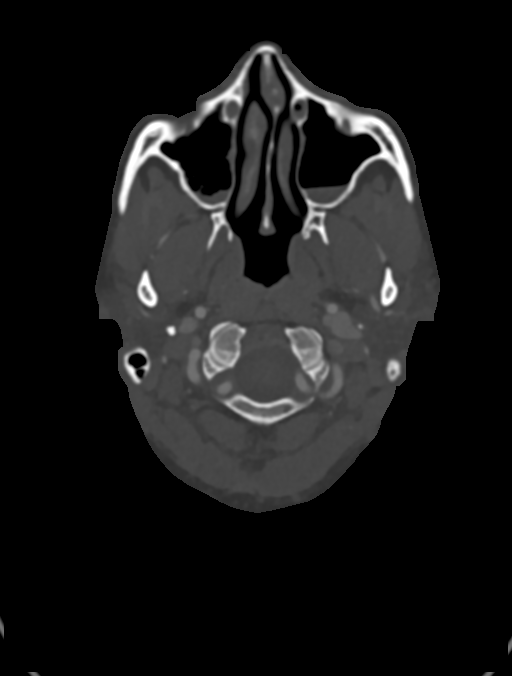
[im 255/357  soft-tissue]
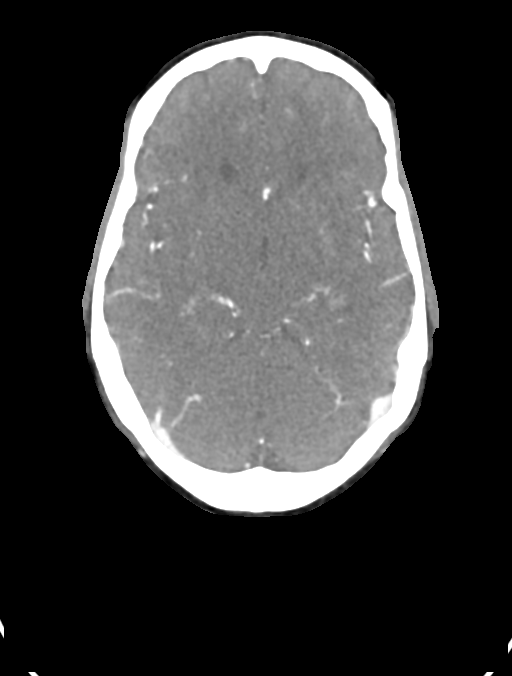
[im 306/357  bone]
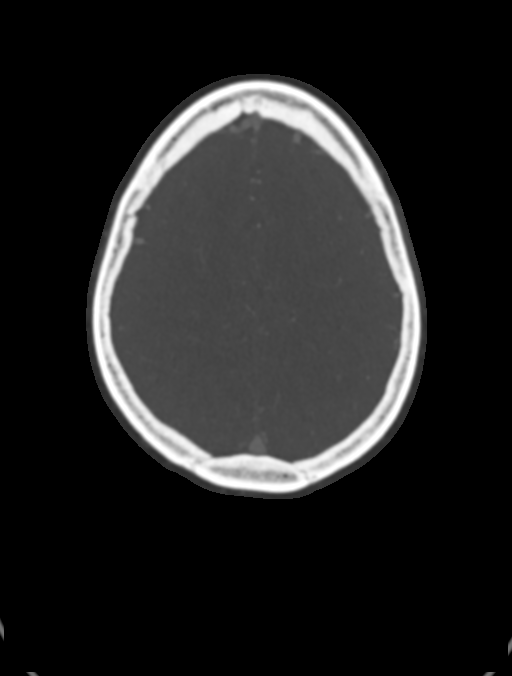

[8 of 33 positions shown; findings below may reference images not displayed]

FINDINGS: CTA NECK FINDINGS

Aortic arch: Bovine arch. Imaged portion shows no evidence of
aneurysm or dissection. No significant stenosis of the major arch
vessel origins.

Right carotid system: No evidence of dissection, stenosis (50% or
greater) or occlusion.

Left carotid system: No evidence of dissection, stenosis (50% or
greater) or occlusion.

Vertebral arteries: Codominant. No evidence of dissection, stenosis
(50% or greater) or occlusion.

Skeleton: Left maxillary molar dental Steidel. Otherwise unremarkable.

Other neck: Negative.

Upper chest: Negative.

Review of the MIP images confirms the above findings

CTA HEAD FINDINGS

Anterior circulation: No significant stenosis, proximal occlusion,
or aneurysm.

Small cluster of vessels in the right sylvian fissure near the
frontoparietal junction measuring 7 x 12 x 8 mm (AP by ML series 7,
image 82 and series 9, image 54) with both arterial and venous
structures. No aneurysm.

Posterior circulation: No significant stenosis, proximal occlusion,
aneurysm, or vascular malformation.

Venous sinuses: As permitted by contrast timing, patent.

Anatomic variants: Anterior communicating artery and bilateral
posterior communicating arteries are present.

Review of the MIP images confirms the above findings
IMPRESSION: 1. Patent carotid and vertebral arteries. No dissection, aneurysm,
or hemodynamically significant stenosis utilizing NASCET criteria.
2. Patent circle of Willis. No large vessel occlusion, aneurysm, or
significant stenosis.

3. Small cluster of vessels in the right sylvian fissure near
frontoparietal junction with both arterial and prominent venous
components measuring up to 12 mm may represent a small arteriovenous
malformation.

By: Cosme Jeffries M.D.

## 2018-09-18 IMAGING — CT CT HEAD CODE STROKE
3 series · 15 of 47 positions shown, 18 images · non-contrast
Comparison: None.

CLINICAL DATA: Code stroke. Left arm numbness and drift. Last seen
normal 2.5 hours ago.

EXAM:
CT HEAD WITHOUT CONTRAST
TECHNIQUE: Contiguous axial images were obtained from the base of the skull
through the vertex without intravenous contrast.

[Series 3: head 5.0 h30s · axial · 0.42mm/px · z∈[-85,+40]mm · 9 of 31 slices shown, 12 images]
[im 3/31  brain]
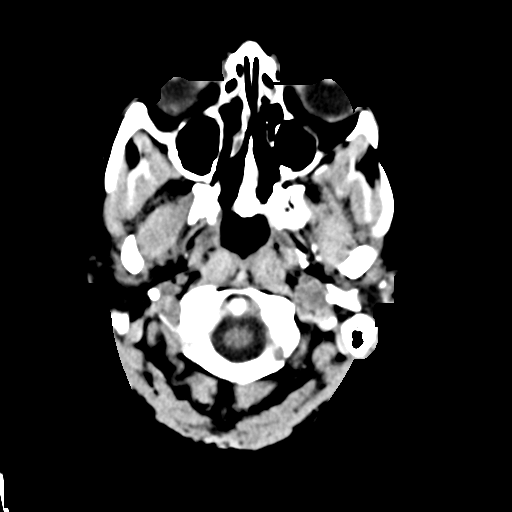
[im 3/31  bone]
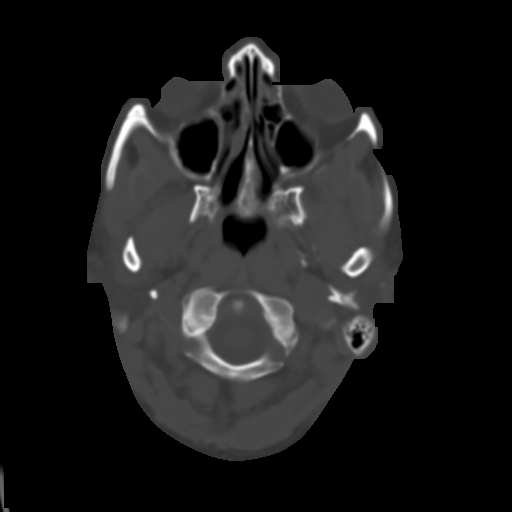
[im 6/31  brain]
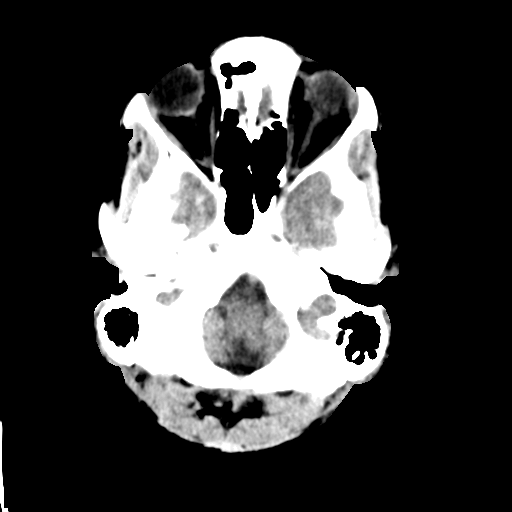
[im 9/31  brain]
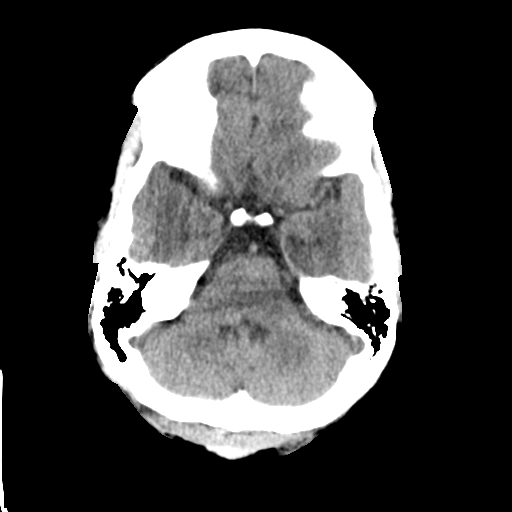
[im 12/31  brain]
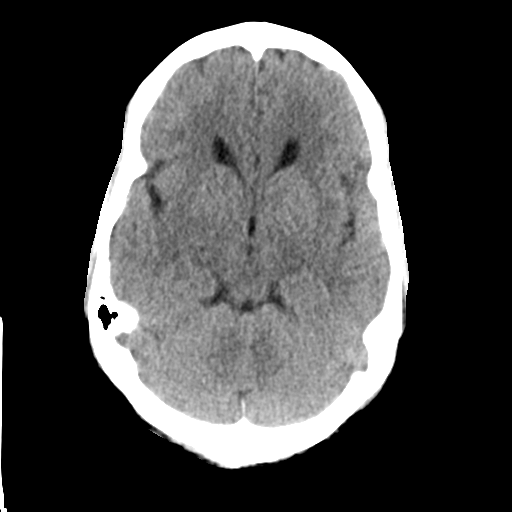
[im 16/31  brain]
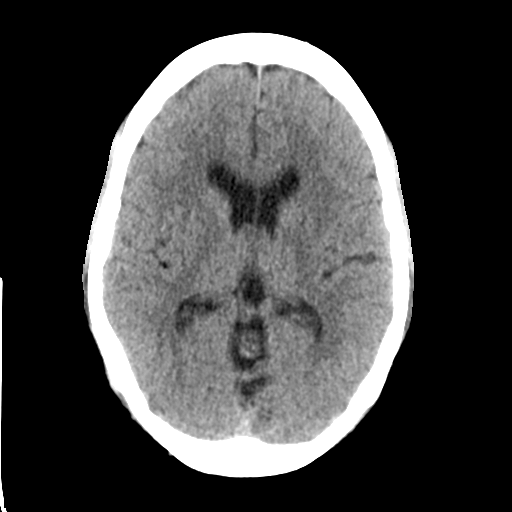
[im 16/31  bone]
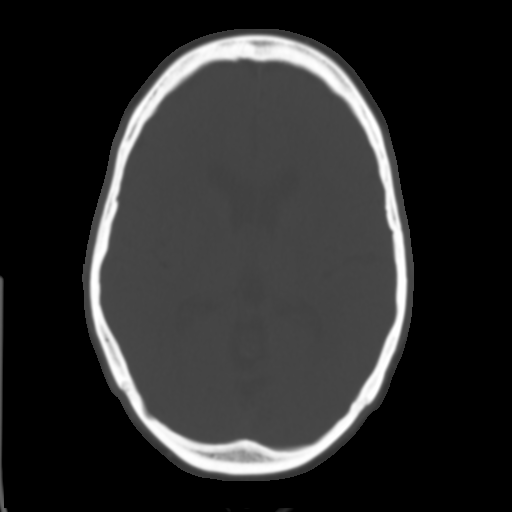
[im 19/31  brain]
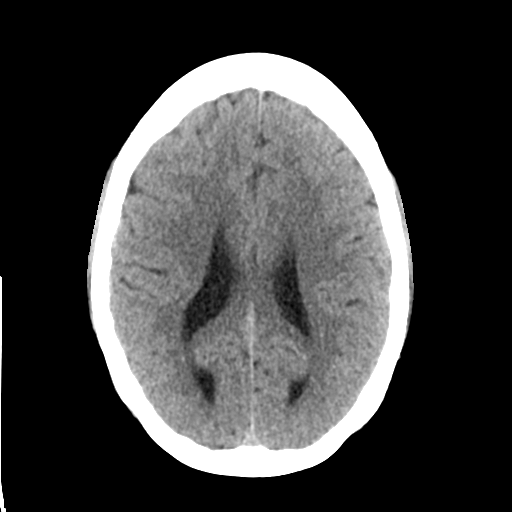
[im 22/31  brain]
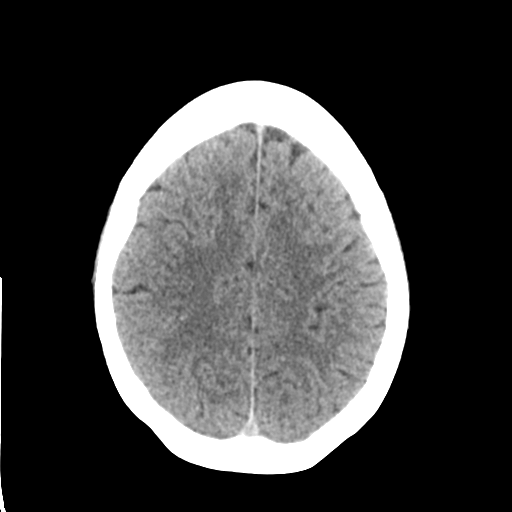
[im 25/31  brain]
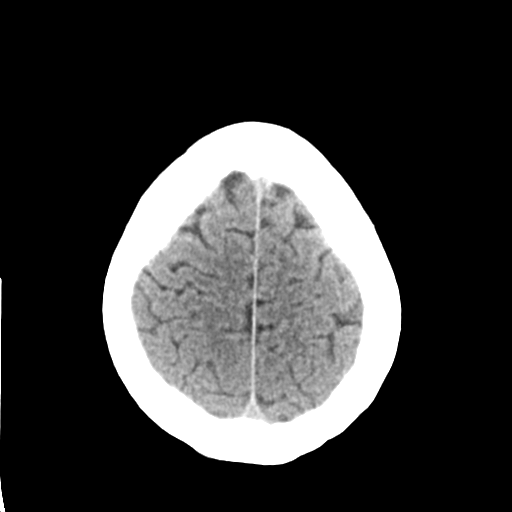
[im 28/31  brain]
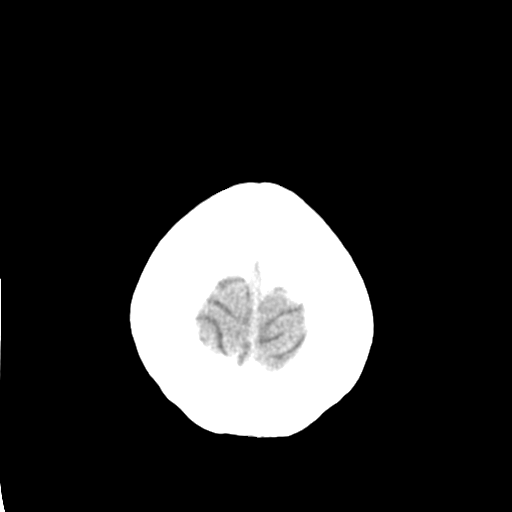
[im 28/31  bone]
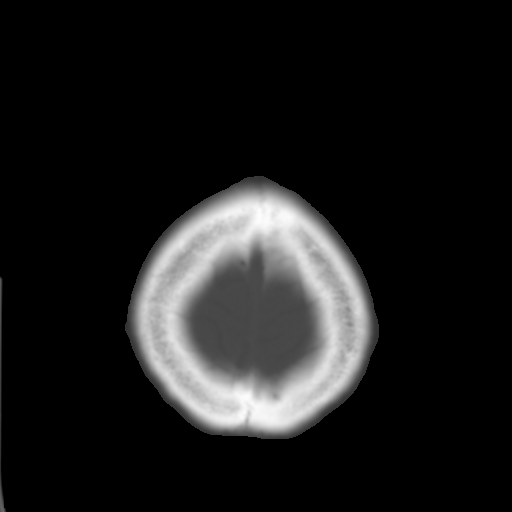

[Series 5: head 3.0 mpr cor · coronal · 0.33mm/px · 3 of 67 slices shown]
[im 23/67  brain]
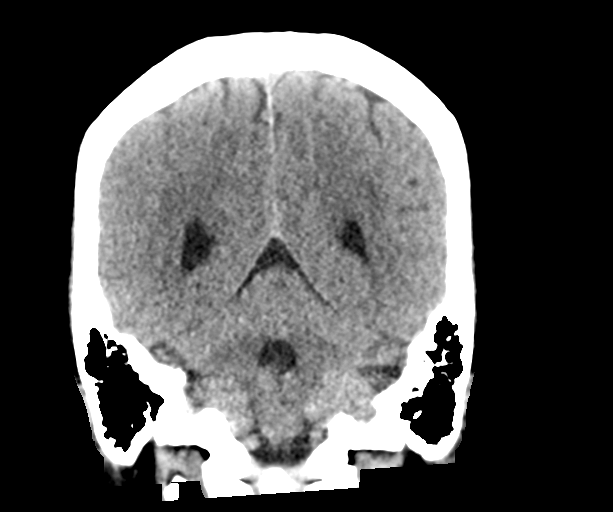
[im 30/67  brain]
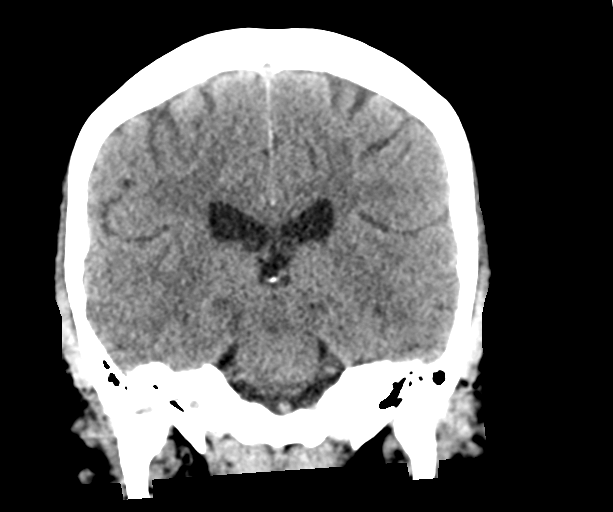
[im 37/67  brain]
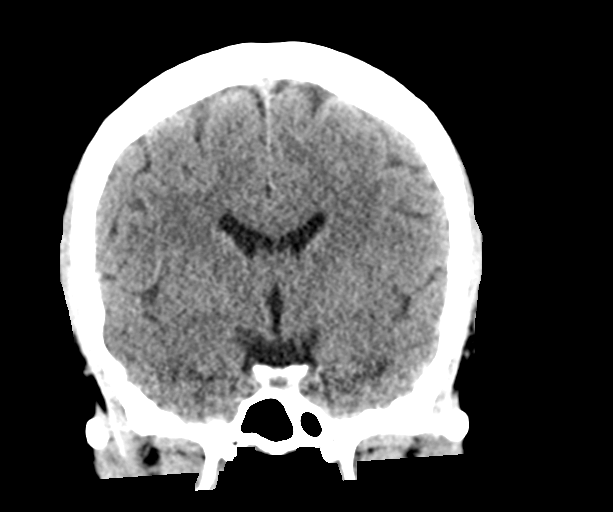

[Series 6: head 3.0 mpr sag · sagittal · 0.31mm/px · 3 of 67 slices shown]
[im 23/67  brain]
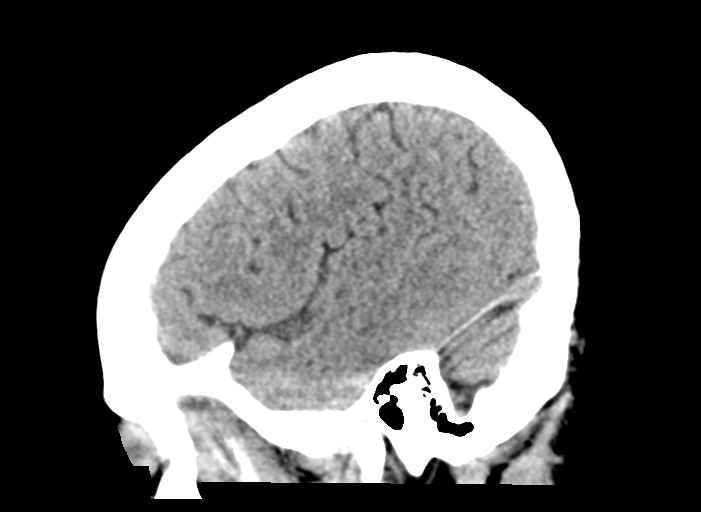
[im 34/67  brain]
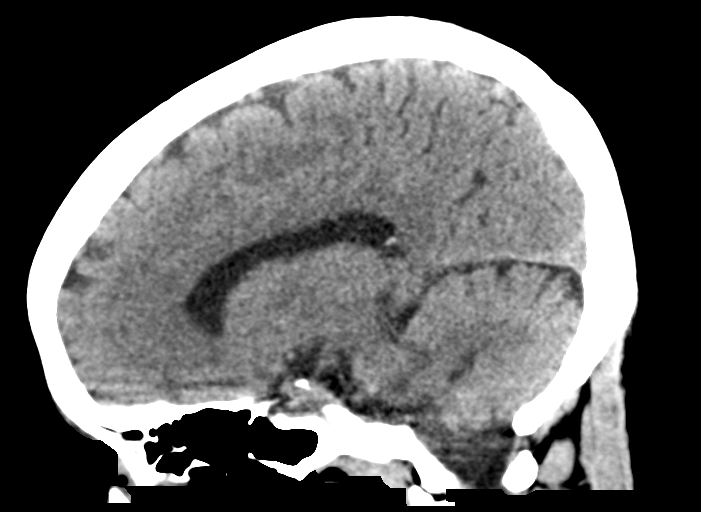
[im 45/67  brain]
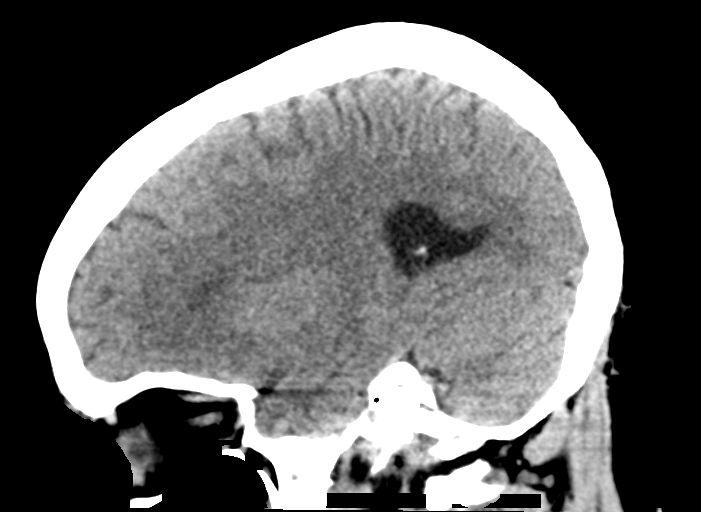

[15 of 47 positions shown; findings below may reference images not displayed]

FINDINGS: Brain: Normal appearance without evidence of atrophy, old or acute
infarction, mass lesion, hemorrhage, hydrocephalus or extra-axial
collection.

Vascular: No abnormal vascular findings.

Skull: Normal

Sinuses/Orbits: Normal

Other: None

ASPECTS (Alberta Stroke Program Early CT Score)

- Ganglionic level infarction (caudate, lentiform nuclei, internal
capsule, insula, M1-M3 cortex): 7

- Supraganglionic infarction (M4-M6 cortex): 3

Total score (0-10 with 10 being normal): 10
IMPRESSION: 1. Normal head CT
2. ASPECTS is 10.

Page placed at the time of interpretation on 03/06/2017 at [DATE] to
Dr. Shalley.

## 2019-01-26 ENCOUNTER — Other Ambulatory Visit: Payer: Self-pay | Admitting: Obstetrics and Gynecology

## 2019-01-26 DIAGNOSIS — N6320 Unspecified lump in the left breast, unspecified quadrant: Secondary | ICD-10-CM

## 2019-01-26 DIAGNOSIS — N6310 Unspecified lump in the right breast, unspecified quadrant: Secondary | ICD-10-CM

## 2019-01-31 ENCOUNTER — Other Ambulatory Visit: Payer: Self-pay | Admitting: Obstetrics and Gynecology

## 2019-01-31 ENCOUNTER — Other Ambulatory Visit: Payer: Self-pay

## 2019-01-31 ENCOUNTER — Ambulatory Visit
Admission: RE | Admit: 2019-01-31 | Discharge: 2019-01-31 | Disposition: A | Discharge status: Home / Self Care | Payer: 59 | Source: Ambulatory Visit | Attending: Obstetrics and Gynecology | Admitting: Obstetrics and Gynecology

## 2019-01-31 DIAGNOSIS — N6310 Unspecified lump in the right breast, unspecified quadrant: Secondary | ICD-10-CM

## 2019-01-31 DIAGNOSIS — N6320 Unspecified lump in the left breast, unspecified quadrant: Secondary | ICD-10-CM

## 2019-08-04 ENCOUNTER — Ambulatory Visit
Admission: RE | Admit: 2019-08-04 | Discharge: 2019-08-04 | Disposition: A | Discharge status: Home / Self Care | Payer: Managed Care, Other (non HMO) | Source: Ambulatory Visit | Attending: Obstetrics and Gynecology | Admitting: Obstetrics and Gynecology

## 2019-08-04 ENCOUNTER — Other Ambulatory Visit: Payer: Self-pay

## 2019-08-04 ENCOUNTER — Other Ambulatory Visit: Payer: Self-pay | Admitting: Obstetrics and Gynecology

## 2019-08-04 DIAGNOSIS — N6310 Unspecified lump in the right breast, unspecified quadrant: Secondary | ICD-10-CM

## 2020-02-06 ENCOUNTER — Other Ambulatory Visit: Payer: Self-pay

## 2020-02-06 ENCOUNTER — Ambulatory Visit
Admission: RE | Admit: 2020-02-06 | Discharge: 2020-02-06 | Disposition: A | Discharge status: Home / Self Care | Payer: Managed Care, Other (non HMO) | Source: Ambulatory Visit | Attending: Obstetrics and Gynecology | Admitting: Obstetrics and Gynecology

## 2020-02-06 DIAGNOSIS — N6310 Unspecified lump in the right breast, unspecified quadrant: Secondary | ICD-10-CM

## 2023-06-15 ENCOUNTER — Encounter: Payer: Self-pay | Admitting: Podiatry

## 2023-06-15 ENCOUNTER — Ambulatory Visit (INDEPENDENT_AMBULATORY_CARE_PROVIDER_SITE_OTHER): Payer: 59 | Admitting: Podiatry

## 2023-06-15 VITALS — Ht 68.0 in | Wt 147.0 lb

## 2023-06-15 DIAGNOSIS — M79674 Pain in right toe(s): Secondary | ICD-10-CM | POA: Diagnosis not present

## 2023-06-15 DIAGNOSIS — B351 Tinea unguium: Secondary | ICD-10-CM

## 2023-06-15 NOTE — Progress Notes (Signed)
   Chief Complaint  Patient presents with   Nail Problem    Pt is here due to discoloration to greater right toenail, pt states no injury to foot, sometimes tender and painful, states its been like that for a few months.    HPI: 47 y.o. female presenting today as a new patient for evaluation of darkening and thickening to the right hallux nail plate.  Patient is concerned for possible toenail fungus.  Onset about 2 months ago.  She cannot recall an injury or incident that would have elicited the change in the nail plate.  She has not done anything for treatment  No past medical history on file.  Past Surgical History:  Procedure Laterality Date   CESAREAN SECTION     TUBAL LIGATION      Allergies  Allergen Reactions   Strawberry (Diagnostic) Shortness Of Breath and Rash   Penicillins Itching    Felt like skin was crawling Has patient had a PCN reaction causing immediate rash, facial/tongue/throat swelling, SOB or lightheadedness with hypotension: No Has patient had a PCN reaction causing severe rash involving mucus membranes or skin necrosis: No Has patient had a PCN reaction that required hospitalization: No Has patient had a PCN reaction occurring within the last 10 years: Yes If all of the above answers are "NO", then may proceed with Cephalosporin use.      Physical Exam: General: The patient is alert and oriented x3 in no acute distress.  Hyperkeratotic mostly detached dystrophic nail noted to the right hallux nail plate  Dermatology: Skin is warm, dry and supple bilateral lower extremities.   Vascular: Palpable pedal pulses bilaterally. Capillary refill within normal limits.  No appreciable edema.  No erythema.  Neurological: Grossly intact via light touch  Musculoskeletal Exam: No pedal deformities noted  Assessment/Plan of Care: 1.  Onycholysis with underlying subungual debris right hallux nail plate  -Patient evaluated -Mechanical debridement of the toenail plate  was performed today using a nail nipper.  It was debrided all the way back to the base of the nail.  The majority of the nail was detached from the underlying nailbed.  Subungual debris noted.  The area was smoothed with a rotary bur without incident -Topical Tolcylen antifungal cream was dispensed to apply daily as the nail grows out -Return to clinic as needed      Felecia Shelling, DPM [facility]  Dr. Felecia Shelling, DPM    [address] [place], [place] [address]                Office [phone]  Fax [phone]
